# Patient Record
Sex: Female | Born: 1940 | Race: White | Hispanic: No | Marital: Married | Smoking: Never smoker
Health system: Southern US, Community
[De-identification: ages and names within clinical notes are randomized; demographics above are authoritative.]

## PROBLEM LIST (undated history)

## (undated) DIAGNOSIS — I729 Aneurysm of unspecified site: Secondary | ICD-10-CM

## (undated) DIAGNOSIS — I499 Cardiac arrhythmia, unspecified: Secondary | ICD-10-CM

## (undated) DIAGNOSIS — E785 Hyperlipidemia, unspecified: Secondary | ICD-10-CM

## (undated) DIAGNOSIS — M810 Age-related osteoporosis without current pathological fracture: Secondary | ICD-10-CM

## (undated) DIAGNOSIS — I10 Essential (primary) hypertension: Secondary | ICD-10-CM

## (undated) DIAGNOSIS — F419 Anxiety disorder, unspecified: Secondary | ICD-10-CM

## (undated) DIAGNOSIS — R011 Cardiac murmur, unspecified: Secondary | ICD-10-CM

## (undated) HISTORY — PX: BREAST CYST EXCISION: SHX579

---

## 1988-01-10 HISTORY — PX: BREAST BIOPSY: SHX20

## 2011-01-10 HISTORY — PX: EYE SURGERY: SHX253

## 2020-03-17 ENCOUNTER — Other Ambulatory Visit: Payer: Self-pay | Admitting: Internal Medicine

## 2020-03-17 ENCOUNTER — Other Ambulatory Visit (HOSPITAL_COMMUNITY): Payer: Self-pay | Admitting: Internal Medicine

## 2020-03-17 DIAGNOSIS — I728 Aneurysm of other specified arteries: Secondary | ICD-10-CM

## 2020-04-02 ENCOUNTER — Other Ambulatory Visit: Payer: Self-pay

## 2020-04-02 ENCOUNTER — Ambulatory Visit
Admission: RE | Admit: 2020-04-02 | Discharge: 2020-04-02 | Disposition: A | Discharge status: Home / Self Care | Payer: Medicare PPO | Source: Ambulatory Visit | Attending: Internal Medicine | Admitting: Internal Medicine

## 2020-04-02 DIAGNOSIS — I728 Aneurysm of other specified arteries: Secondary | ICD-10-CM | POA: Diagnosis not present

## 2020-04-02 HISTORY — DX: Essential (primary) hypertension: I10

## 2020-04-02 LAB — POCT I-STAT CREATININE: Creatinine, Ser: 0.6 mg/dL (ref 0.44–1.00)

## 2020-04-02 MED ORDER — IOHEXOL 350 MG/ML SOLN
80.0000 mL | Freq: Once | INTRAVENOUS | Status: AC | PRN
  Administered 2020-04-02: 80 mL via INTRAVENOUS

## 2020-08-26 ENCOUNTER — Encounter: Payer: Self-pay | Admitting: *Deleted

## 2020-08-27 ENCOUNTER — Encounter: Payer: Self-pay | Admitting: *Deleted

## 2020-08-27 ENCOUNTER — Encounter
Admission: RE | Disposition: A | Discharge status: Home / Self Care | Payer: Self-pay | Source: Home / Self Care | Attending: Gastroenterology

## 2020-08-27 ENCOUNTER — Ambulatory Visit
Admission: RE | Admit: 2020-08-27 | Discharge: 2020-08-27 | Disposition: A | Discharge status: Home / Self Care | Payer: Medicare PPO | Attending: Gastroenterology | Admitting: Gastroenterology

## 2020-08-27 ENCOUNTER — Ambulatory Visit: Payer: Medicare PPO | Admitting: Anesthesiology

## 2020-08-27 DIAGNOSIS — F419 Anxiety disorder, unspecified: Secondary | ICD-10-CM | POA: Insufficient documentation

## 2020-08-27 DIAGNOSIS — K640 First degree hemorrhoids: Secondary | ICD-10-CM | POA: Diagnosis not present

## 2020-08-27 DIAGNOSIS — Z1211 Encounter for screening for malignant neoplasm of colon: Secondary | ICD-10-CM | POA: Insufficient documentation

## 2020-08-27 DIAGNOSIS — I10 Essential (primary) hypertension: Secondary | ICD-10-CM | POA: Insufficient documentation

## 2020-08-27 DIAGNOSIS — K635 Polyp of colon: Secondary | ICD-10-CM | POA: Diagnosis not present

## 2020-08-27 DIAGNOSIS — Z885 Allergy status to narcotic agent status: Secondary | ICD-10-CM | POA: Insufficient documentation

## 2020-08-27 DIAGNOSIS — Z7901 Long term (current) use of anticoagulants: Secondary | ICD-10-CM | POA: Diagnosis not present

## 2020-08-27 DIAGNOSIS — Z79899 Other long term (current) drug therapy: Secondary | ICD-10-CM | POA: Insufficient documentation

## 2020-08-27 DIAGNOSIS — I4891 Unspecified atrial fibrillation: Secondary | ICD-10-CM | POA: Diagnosis not present

## 2020-08-27 HISTORY — DX: Hyperlipidemia, unspecified: E78.5

## 2020-08-27 HISTORY — DX: Anxiety disorder, unspecified: F41.9

## 2020-08-27 HISTORY — DX: Cardiac murmur, unspecified: R01.1

## 2020-08-27 HISTORY — PX: COLONOSCOPY WITH PROPOFOL: SHX5780

## 2020-08-27 HISTORY — DX: Aneurysm of unspecified site: I72.9

## 2020-08-27 HISTORY — DX: Age-related osteoporosis without current pathological fracture: M81.0

## 2020-08-27 HISTORY — DX: Cardiac arrhythmia, unspecified: I49.9

## 2020-08-27 SURGERY — COLONOSCOPY WITH PROPOFOL
Anesthesia: General

## 2020-08-27 MED ORDER — SODIUM CHLORIDE 0.9 % IV SOLN
INTRAVENOUS | Status: DC
  Administered 2020-08-27: 1000 mL via INTRAVENOUS

## 2020-08-27 MED ORDER — PROPOFOL 500 MG/50ML IV EMUL
INTRAVENOUS | Status: AC
  Filled 2020-08-27: qty 50

## 2020-08-27 MED ORDER — PROPOFOL 500 MG/50ML IV EMUL
INTRAVENOUS | Status: DC | PRN
  Administered 2020-08-27: 175 ug/kg/min via INTRAVENOUS

## 2020-08-27 MED ORDER — LIDOCAINE HCL (CARDIAC) PF 100 MG/5ML IV SOSY
PREFILLED_SYRINGE | INTRAVENOUS | Status: DC | PRN
Start: 2020-08-27 — End: 2020-08-27
  Administered 2020-08-27: 50 mg via INTRAVENOUS

## 2020-08-27 MED ORDER — LIDOCAINE HCL (PF) 2 % IJ SOLN
INTRAMUSCULAR | Status: AC
  Filled 2020-08-27: qty 5

## 2020-08-27 MED ORDER — PROPOFOL 10 MG/ML IV BOLUS
INTRAVENOUS | Status: DC | PRN
  Administered 2020-08-27: 20 mg via INTRAVENOUS
  Administered 2020-08-27: 60 mg via INTRAVENOUS
  Administered 2020-08-27 (×2): 20 mg via INTRAVENOUS

## 2020-08-27 MED ORDER — DEXMEDETOMIDINE (PRECEDEX) IN NS 20 MCG/5ML (4 MCG/ML) IV SYRINGE
PREFILLED_SYRINGE | INTRAVENOUS | Status: DC | PRN
  Administered 2020-08-27: 8 ug via INTRAVENOUS

## 2020-08-27 NOTE — Transfer of Care (Signed)
Immediate Anesthesia Transfer of Care Note  Patient: Elizabeth Sweeney  Procedure(s) Performed: COLONOSCOPY WITH PROPOFOL  Patient Location: PACU  Anesthesia Type:General  Level of Consciousness: sedated  Airway & Oxygen Therapy: Patient Spontanous Breathing  Post-op Assessment: Report given to RN and Post -op Vital signs reviewed and stable  Post vital signs: Reviewed and stable  Last Vitals:  Vitals Value Taken Time  BP 112/64 08/27/20 1132  Temp    Pulse 74 08/27/20 1132  Resp 28 08/27/20 1132  SpO2 100 % 08/27/20 1132    Last Pain:  Vitals:   08/27/20 1024  TempSrc: Temporal  PainSc: 0-No pain         Complications: No notable events documented.

## 2020-08-27 NOTE — H&P (Signed)
Outpatient short stay form Pre-procedure 08/27/2020  Regis Bill, MD  Primary Physician: Lynnea Ferrier, MD  Reason for visit:  Surveillance colonoscopy  History of present illness:   80 y/o lady with history of a. Fib on eliquis with last dose 5 days ago, hypertension, and anxiety here for surveillance colonoscopy. Last colonoscopy was 5 years ago with two small Ta's. No family history of GI malignancies. No significant abdominal surgeries. No new symptoms.    Current Facility-Administered Medications:    0.9 %  sodium chloride infusion, , Intravenous, Continuous, Jaiceon Collister, Rossie Muskrat, MD, Last Rate: 20 mL/hr at 08/27/20 1049, Continued from Pre-op at 08/27/20 1049  Medications Prior to Admission  Medication Sig Dispense Refill Last Dose   apixaban (ELIQUIS) 5 MG TABS tablet Take 5 mg by mouth 2 (two) times daily.   08/22/2020   calcium carbonate (OS-CAL) 600 MG TABS tablet Take 1 tablet by mouth daily with breakfast.   Past Week   conjugated estrogens (PREMARIN) vaginal cream Place 1 Applicatorful vaginally 2 (two) times a week.   08/26/2020   lisinopril (ZESTRIL) 20 MG tablet Take 700 mg by mouth daily.   08/27/2020 at 0700   loratadine (CLARITIN) 10 MG tablet Take 10 mg by mouth daily.   Past Week   ofloxacin (OCUFLOX) 0.3 % ophthalmic solution 1 drop 4 (four) times daily.   Past Week   olopatadine (PATANOL) 0.1 % ophthalmic solution 1 drop at bedtime as needed for allergies.   Past Week   Omega-3 300 MG CAPS Take 1 capsule by mouth daily.   Past Week   promethazine (PHENERGAN) 25 MG suppository Place 25 mg rectally every 6 (six) hours as needed for nausea or vomiting.    at prn   psyllium (METAMUCIL) 58.6 % powder Take 1 packet by mouth daily.   Past Week   ALPRAZolam (XANAX) 0.25 MG tablet Take 0.25 mg by mouth at bedtime as needed for anxiety.    at prn   Suvorexant (BELSOMRA) 15 MG TABS Take 15 mg by mouth at bedtime as needed.        Allergies  Allergen Reactions    Codeine Nausea Only     Past Medical History:  Diagnosis Date   Aneurysm (HCC)    Anxiety    Dysrhythmia    Atrial Fibrillation   Heart murmur    Hyperlipidemia    Hypertension    Osteoporosis     Review of systems:  Otherwise negative.    Physical Exam  Gen: Alert, oriented. Appears stated age.  HEENT: PERRLA. Lungs: No respiratory distress CV: RRR Abd: soft, benign, no masses Ext: No edema    Planned procedures: Proceed with colonoscopy. The patient understands the nature of the planned procedure, indications, risks, alternatives and potential complications including but not limited to bleeding, infection, perforation, damage to internal organs and possible oversedation/side effects from anesthesia. The patient agrees and gives consent to proceed.  Please refer to procedure notes for findings, recommendations and patient disposition/instructions.     Regis Bill, MD [facility]

## 2020-08-27 NOTE — Anesthesia Procedure Notes (Signed)
Date/Time: 08/27/2020 10:57 AM Performed by: Ginger Carne, CRNA Pre-anesthesia Checklist: Patient identified, Emergency Drugs available, Suction available and Patient being monitored Patient Re-evaluated:Patient Re-evaluated prior to induction Preoxygenation: Pre-oxygenation with 100% oxygen Induction Type: IV induction

## 2020-08-27 NOTE — Anesthesia Preprocedure Evaluation (Signed)
Anesthesia Evaluation  Patient identified by MRN, date of birth, ID band Patient awake    Reviewed: Allergy & Precautions, NPO status , Patient's Chart, lab work & pertinent test results  History of Anesthesia Complications Negative for: history of anesthetic complications  Airway Mallampati: III  TM Distance: <3 FB Neck ROM: limited    Dental  (+) Chipped   Pulmonary neg pulmonary ROS, neg shortness of breath,    Pulmonary exam normal        Cardiovascular Exercise Tolerance: Good hypertension, Normal cardiovascular exam+ dysrhythmias Atrial Fibrillation      Neuro/Psych Anxiety negative neurological ROS  negative psych ROS   GI/Hepatic negative GI ROS, Neg liver ROS, neg GERD  ,  Endo/Other  negative endocrine ROS  Renal/GU negative Renal ROS  negative genitourinary   Musculoskeletal   Abdominal   Peds  Hematology negative hematology ROS (+)   Anesthesia Other Findings Past Medical History: No date: Aneurysm (HCC) No date: Anxiety No date: Dysrhythmia     Comment:  Atrial Fibrillation No date: Heart murmur No date: Hyperlipidemia No date: Hypertension No date: Osteoporosis  Past Surgical History: 2013: EYE SURGERY; Bilateral     Comment:  Cataract Extraction  BMI    Body Mass Index: 21.35 kg/m      Reproductive/Obstetrics negative OB ROS                             Anesthesia Physical Anesthesia Plan  ASA: 3  Anesthesia Plan: General   Post-op Pain Management:    Induction: Intravenous  PONV Risk Score and Plan: Propofol infusion and TIVA  Airway Management Planned: Natural Airway and Nasal Cannula  Additional Equipment:   Intra-op Plan:   Post-operative Plan:   Informed Consent: I have reviewed the patients History and Physical, chart, labs and discussed the procedure including the risks, benefits and alternatives for the proposed anesthesia with the  patient or authorized representative who has indicated his/her understanding and acceptance.     Dental Advisory Given  Plan Discussed with: Anesthesiologist, CRNA and Surgeon  Anesthesia Plan Comments: (Patient consented for risks of anesthesia including but not limited to:  - adverse reactions to medications - risk of airway placement if required - damage to eyes, teeth, lips or other oral mucosa - nerve damage due to positioning  - sore throat or hoarseness - Damage to heart, brain, nerves, lungs, other parts of body or loss of life  Patient voiced understanding.)        Anesthesia Quick Evaluation

## 2020-08-27 NOTE — Op Note (Signed)
[facility] Patient Name: Elizabeth Sweeney Procedure Date: 08/27/2020 10:55 AM MRN: [id number] Account #: [id number] Date of Birth: 1940-07-24 Admit Type: Outpatient Age: 80 Room: [facility] ENDO ROOM 3 Gender: Female Note Status: Finalized Procedure:             Colonoscopy Indications:           Surveillance: Personal history of adenomatous polyps                         on last colonoscopy 5 years ago Providers:             Andrey Farmer MD, MD Referring MD:          Ramonita Lab, MD (Referring MD) Medicines:             Monitored Anesthesia Care Complications:         No immediate complications. Estimated blood loss:                         Minimal. Procedure:             Pre-Anesthesia Assessment:                        - Prior to the procedure, a History and Physical was                         performed, and patient medications and allergies were                         reviewed. The patient is competent. The risks and                         benefits of the procedure and the sedation options and                         risks were discussed with the patient. All questions                         were answered and informed consent was obtained.                         Patient identification and proposed procedure were                         verified by the physician, the nurse, the anesthetist                         and the technician in the endoscopy suite. Mental                         Status Examination: alert and oriented. Airway                         Examination: normal oropharyngeal airway and neck                         mobility. Respiratory Examination: clear to  auscultation. CV Examination: normal. Prophylactic                         Antibiotics: The patient does not require prophylactic                         antibiotics. Prior Anticoagulants: The patient has                         taken Eliquis (apixaban),  last dose was 5 days prior                         to procedure. ASA Grade Assessment: II - A patient                         with mild systemic disease. After reviewing the risks                         and benefits, the patient was deemed in satisfactory                         condition to undergo the procedure. The anesthesia                         plan was to use monitored anesthesia care (MAC).                         Immediately prior to administration of medications,                         the patient was re-assessed for adequacy to receive                         sedatives. The heart rate, respiratory rate, oxygen                         saturations, blood pressure, adequacy of pulmonary                         ventilation, and response to care were monitored                         throughout the procedure. The physical status of the                         patient was re-assessed after the procedure.                        After obtaining informed consent, the colonoscope was                         passed under direct vision. Throughout the procedure,                         the patient's blood pressure, pulse, and oxygen                         saturations were monitored continuously. The  Colonoscope was introduced through the anus and                         advanced to the the cecum, identified by appendiceal                         orifice and ileocecal valve. The colonoscopy was                         performed without difficulty. The patient tolerated                         the procedure well. The quality of the bowel                         preparation was good. Findings:      The perianal and digital rectal examinations were normal.      A 2 mm polyp was found in the sigmoid colon. The polyp was sessile. The       polyp was removed with a jumbo cold forceps. Resection and retrieval       were complete. Estimated blood loss was minimal.       Internal hemorrhoids were found during retroflexion. The hemorrhoids       were Grade I (internal hemorrhoids that do not prolapse).      The exam was otherwise without abnormality on direct and retroflexion       views. Impression:            - One 2 mm polyp in the sigmoid colon, removed with a                         jumbo cold forceps. Resected and retrieved.                        - Internal hemorrhoids.                        - The examination was otherwise normal on direct and                         retroflexion views. Recommendation:        - Discharge patient to home.                        - Resume previous diet.                        - Continue present medications.                        - Await pathology results.                        - Repeat colonoscopy is not recommended due to current                         age (71 years or older) for surveillance.                        - Return to referring physician as previously  scheduled. Procedure Code(s):     --- Professional ---                        [phone], Colonoscopy, flexible; with biopsy, single or                         multiple Diagnosis Code(s):     --- Professional ---                        Z86.010, Personal history of colonic polyps                        K63.5, Polyp of colon                        K64.0, First degree hemorrhoids CPT copyright 2019 American Medical Association. All rights reserved. The codes documented in this report are preliminary and upon coder review may  be revised to meet current compliance requirements. Andrey Farmer MD, MD 08/27/2020 11:28:23 AM Number of Addenda: 0 Note Initiated On: 08/27/2020 10:55 AM Scope Withdrawal Time: 0 hours 8 minutes 50 seconds  Total Procedure Duration: 0 hours 19 minutes 6 seconds  Estimated Blood Loss:  Estimated blood loss was minimal.      [facility]

## 2020-08-27 NOTE — Anesthesia Postprocedure Evaluation (Signed)
Anesthesia Post Note  Patient: Elizabeth Sweeney  Procedure(s) Performed: COLONOSCOPY WITH PROPOFOL  Patient location during evaluation: Endoscopy Anesthesia Type: General Level of consciousness: awake and alert Pain management: pain level controlled Vital Signs Assessment: post-procedure vital signs reviewed and stable Respiratory status: spontaneous breathing, nonlabored ventilation, respiratory function stable and patient connected to nasal cannula oxygen Cardiovascular status: blood pressure returned to baseline and stable Postop Assessment: no apparent nausea or vomiting Anesthetic complications: no   No notable events documented.   Last Vitals:  Vitals:   08/27/20 1137 08/27/20 1206  BP: 109/64 129/78  Pulse: 74 78  Resp: 16   Temp:    SpO2: 99% 99%    Last Pain:  Vitals:   08/27/20 1137  TempSrc:   PainSc: 0-No pain                 Cleda Mccreedy Tobie Hellen

## 2020-08-27 NOTE — Interval H&P Note (Signed)
History and Physical Interval Note:  08/27/2020 10:54 AM  Elizabeth Sweeney  has presented today for surgery, with the diagnosis of HX ADEN POLYPS.  The various methods of treatment have been discussed with the patient and family. After consideration of risks, benefits and other options for treatment, the patient has consented to  Procedure(s): COLONOSCOPY WITH PROPOFOL (N/A) as a surgical intervention.  The patient's history has been reviewed, patient examined, no change in status, stable for surgery.  I have reviewed the patient's chart and labs.  Questions were answered to the patient's satisfaction.     Regis Bill  Ok to proceed with colonoscopy

## 2020-08-30 ENCOUNTER — Encounter: Payer: Self-pay | Admitting: Gastroenterology

## 2020-08-30 LAB — SURGICAL PATHOLOGY

## 2020-09-20 ENCOUNTER — Other Ambulatory Visit: Payer: Self-pay | Admitting: Internal Medicine

## 2020-09-20 DIAGNOSIS — Z1231 Encounter for screening mammogram for malignant neoplasm of breast: Secondary | ICD-10-CM

## 2020-09-29 ENCOUNTER — Ambulatory Visit
Admission: RE | Admit: 2020-09-29 | Discharge: 2020-09-29 | Disposition: A | Discharge status: Home / Self Care | Payer: Medicare PPO | Source: Ambulatory Visit | Attending: Internal Medicine | Admitting: Internal Medicine

## 2020-09-29 ENCOUNTER — Other Ambulatory Visit: Payer: Self-pay

## 2020-09-29 DIAGNOSIS — Z1231 Encounter for screening mammogram for malignant neoplasm of breast: Secondary | ICD-10-CM | POA: Diagnosis not present

## 2020-10-01 ENCOUNTER — Inpatient Hospital Stay
Admission: RE | Admit: 2020-10-01 | Discharge: 2020-10-01 | Disposition: A | Discharge status: Home / Self Care | Payer: Self-pay | Source: Ambulatory Visit | Attending: *Deleted | Admitting: *Deleted

## 2020-10-01 ENCOUNTER — Other Ambulatory Visit: Payer: Self-pay | Admitting: *Deleted

## 2020-10-01 DIAGNOSIS — Z1231 Encounter for screening mammogram for malignant neoplasm of breast: Secondary | ICD-10-CM

## 2021-03-25 ENCOUNTER — Other Ambulatory Visit: Payer: Self-pay | Admitting: Internal Medicine

## 2021-03-25 DIAGNOSIS — I728 Aneurysm of other specified arteries: Secondary | ICD-10-CM

## 2021-04-26 ENCOUNTER — Ambulatory Visit
Admission: RE | Admit: 2021-04-26 | Discharge: 2021-04-26 | Disposition: A | Discharge status: Home / Self Care | Payer: Medicare PPO | Source: Ambulatory Visit | Attending: Internal Medicine | Admitting: Internal Medicine

## 2021-04-26 ENCOUNTER — Other Ambulatory Visit: Payer: Medicare PPO

## 2021-04-26 DIAGNOSIS — I728 Aneurysm of other specified arteries: Secondary | ICD-10-CM | POA: Diagnosis present

## 2021-04-26 LAB — POCT I-STAT CREATININE: Creatinine, Ser: 0.9 mg/dL (ref 0.44–1.00)

## 2021-04-26 MED ORDER — IOHEXOL 350 MG/ML SOLN
100.0000 mL | Freq: Once | INTRAVENOUS | Status: AC | PRN
  Administered 2021-04-26: 100 mL via INTRAVENOUS

## 2021-09-26 ENCOUNTER — Other Ambulatory Visit: Payer: Self-pay | Admitting: Internal Medicine

## 2021-09-26 DIAGNOSIS — Z1231 Encounter for screening mammogram for malignant neoplasm of breast: Secondary | ICD-10-CM

## 2021-10-18 ENCOUNTER — Ambulatory Visit
Admission: RE | Admit: 2021-10-18 | Discharge: 2021-10-18 | Disposition: A | Discharge status: Home / Self Care | Payer: Medicare PPO | Source: Ambulatory Visit | Attending: Internal Medicine | Admitting: Internal Medicine

## 2021-10-18 DIAGNOSIS — Z1231 Encounter for screening mammogram for malignant neoplasm of breast: Secondary | ICD-10-CM | POA: Insufficient documentation

## 2022-09-05 ENCOUNTER — Other Ambulatory Visit: Payer: Self-pay | Admitting: Internal Medicine

## 2022-09-05 DIAGNOSIS — Z1231 Encounter for screening mammogram for malignant neoplasm of breast: Secondary | ICD-10-CM

## 2022-10-20 ENCOUNTER — Ambulatory Visit
Admission: RE | Admit: 2022-10-20 | Discharge: 2022-10-20 | Disposition: A | Discharge status: Home / Self Care | Payer: Medicare PPO | Source: Ambulatory Visit | Attending: Internal Medicine | Admitting: Internal Medicine

## 2022-10-20 DIAGNOSIS — Z1231 Encounter for screening mammogram for malignant neoplasm of breast: Secondary | ICD-10-CM | POA: Diagnosis present

## 2023-01-13 ENCOUNTER — Other Ambulatory Visit: Payer: Self-pay

## 2023-01-13 ENCOUNTER — Emergency Department: Payer: Medicare PPO

## 2023-01-13 ENCOUNTER — Emergency Department
Admission: EM | Admit: 2023-01-13 | Discharge: 2023-01-13 | Disposition: A | Discharge status: Home / Self Care | Payer: Medicare PPO | Attending: Emergency Medicine | Admitting: Emergency Medicine

## 2023-01-13 DIAGNOSIS — I10 Essential (primary) hypertension: Secondary | ICD-10-CM | POA: Insufficient documentation

## 2023-01-13 DIAGNOSIS — I4891 Unspecified atrial fibrillation: Secondary | ICD-10-CM | POA: Insufficient documentation

## 2023-01-13 DIAGNOSIS — M7121 Synovial cyst of popliteal space [Baker], right knee: Secondary | ICD-10-CM | POA: Insufficient documentation

## 2023-01-13 DIAGNOSIS — M79604 Pain in right leg: Secondary | ICD-10-CM | POA: Diagnosis present

## 2023-01-13 NOTE — ED Provider Triage Note (Signed)
 Emergency Medicine Provider Triage Evaluation Note  Elizabeth Sweeney , a 83 y.o. female  was evaluated in triage.  Pt complains of pain behind the right knee. Started a few days ago, concerned about a possible blood clot. Takes eliquis. Patient has not taken an pain meds for it so far.   Review of Systems  Positive: Right knee pain Negative:   Physical Exam  There were no vitals taken for this visit. Gen:   Awake, no distress   Resp:  Normal effort  MSK:   Moves extremities without difficulty  Other:    Medical Decision Making  Medically screening exam initiated at 11:59 AM.  Appropriate orders placed.  Sahiba Granholm was informed that the remainder of the evaluation will be completed by another provider, this initial triage assessment does not replace that evaluation, and the importance of remaining in the ED until their evaluation is complete.     Cleaster Tinnie LABOR, PA-C 01/13/23 1201

## 2023-01-13 NOTE — ED Notes (Signed)
 RLE pain to popliteal area since 2 weeks, now some pain to R calf. No redness noted. Pt takes Eliquis for a fib.

## 2023-01-13 NOTE — ED Provider Notes (Signed)
 [facility] Provider Note    Event Date/Time   First MD Initiated Contact with Patient 01/13/23 1232     (approximate)   History   Leg Pain   HPI  Elizabeth Sweeney is a 83 y.o. female  with PMH of HTN, anxiety, afib presents for evaluation of pain behind her right knee.  Patient states that this is something that has bothered her on occasion on and off for several months.  In the last week she has had increased pain behind the knee.  She presents today she noticed some swelling and wanted to make sure she did not have a blood clot.  She endorses a very active lifestyle including yoga and regular visits to the gym to use the elliptical and stationary bike.      Physical Exam   Triage Vital Signs: ED Triage Vitals  Encounter Vitals Group     BP 01/13/23 1203 (!) 154/84     Systolic BP Percentile --      Diastolic BP Percentile --      Pulse Rate 01/13/23 1203 93     Resp 01/13/23 1203 20     Temp 01/13/23 1203 98 F (36.7 C)     Temp Source 01/13/23 1203 Oral     SpO2 01/13/23 1203 99 %     Weight --      Height --      Head Circumference --      Peak Flow --      Pain Score 01/13/23 1202 6     Pain Loc --      Pain Education --      Exclude from Growth Chart --     Most recent vital signs: Vitals:   01/13/23 1203  BP: (!) 154/84  Pulse: 93  Resp: 20  Temp: 98 F (36.7 C)  SpO2: 99%   General: Awake, no distress.  CV:  Good peripheral perfusion.  Resp:  Normal effort.  Abd:  No distention.  Other:  Knee is not swollen when compared with the left, no overlying skin changes, mild tenderness to palpation of the posterior knee, palpable cyst can be felt in the posterior lateral right knee, ROM maintained, dorsalis pedis pulse 2+ and regular.   ED Results / Procedures / Treatments   Labs (all labs ordered are listed, but only abnormal results are displayed) Labs Reviewed - No data to display   RADIOLOGY  Right lower extremity  ultrasound obtained, interpreted the images as well as reviewed the radiologist report which was negative for DVT but did show a small popliteal cyst.    PROCEDURES:  Critical Care performed: No  Procedures   MEDICATIONS ORDERED IN ED: Medications - No data to display   IMPRESSION / MDM / ASSESSMENT AND PLAN / ED COURSE  I reviewed the triage vital signs and the nursing notes.                             83 year old female presents for evaluation of posterior right knee pain.  Vital signs stable, aside from elevated blood pressure, patient NAD on exam.  Differential diagnosis includes, but is not limited to, Baker's cyst, DVT, muscle strain, fracture, dislocation.  Patient's presentation is most consistent with acute complicated illness / injury requiring diagnostic workup.  Ultrasound was negative for DVT but did show a small popliteal cyst.  I advised patient to take it easy for  the next couple days, she can resume activities as tolerated.  I also recommended Tylenol, icing and elevation.  She is given information for orthopedic follow-up.  She voiced understanding, all questions were answered and she was stable at discharge.     FINAL CLINICAL IMPRESSION(S) / ED DIAGNOSES   Final diagnoses:  Baker's cyst of knee, right     Rx / DC Orders   ED Discharge Orders     None        Note:  This document was prepared using Dragon voice recognition software and may include unintentional dictation errors.   Cleaster Tinnie LABOR, PA-C 01/13/23 1409    Bradler, Evan K, MD 01/13/23 [phone]

## 2023-01-13 NOTE — ED Triage Notes (Signed)
 Pt to ED via POV from home. Pt reports right posterior knee  pain x1wk. Pt denies SOB or CP. Pt reports on Eliquis for afib.

## 2023-01-13 NOTE — Discharge Instructions (Addendum)
 You can take 650 mg of tylenol as needed for pain. Ice and elevate your knee as needed. This will help with the swelling and pain. You could also try wearing a supportive knee sleeve.

## 2023-08-03 IMAGING — MG MM DIGITAL SCREENING BILAT W/ TOMO AND CAD
8 series · 8 of 24 positions shown · non-contrast
Comparison: Previous exam(s).

CLINICAL DATA: Screening.

EXAM:
DIGITAL SCREENING BILATERAL MAMMOGRAM WITH TOMOSYNTHESIS AND CAD
TECHNIQUE: Bilateral screening digital craniocaudal and mediolateral oblique
mammograms were obtained. Bilateral screening digital breast
tomosynthesis was performed. The images were evaluated with
computer-aided detection.

[R CC synth-2D]
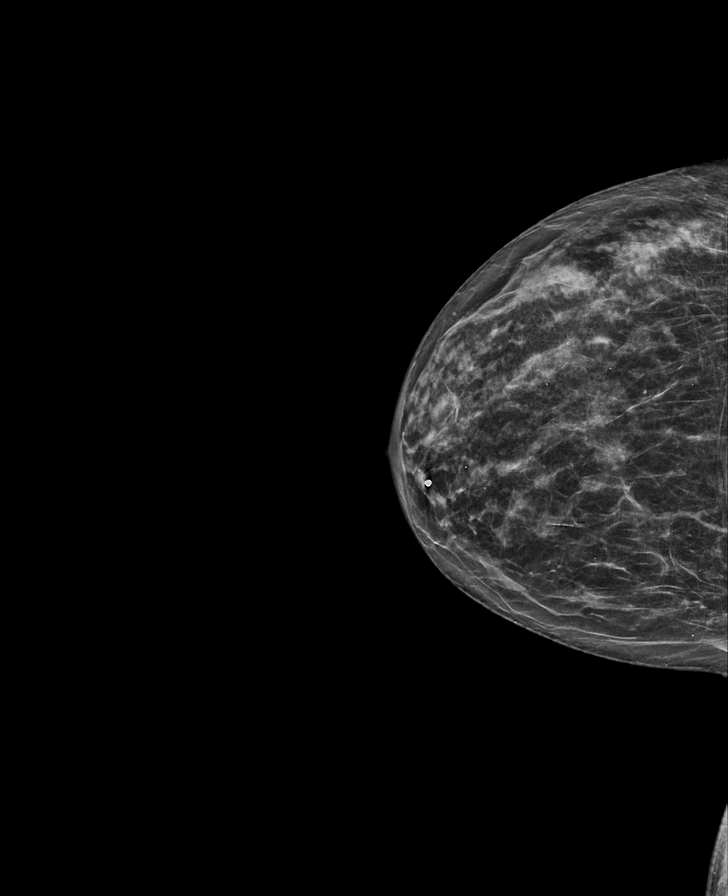

[R MLO synth-2D]
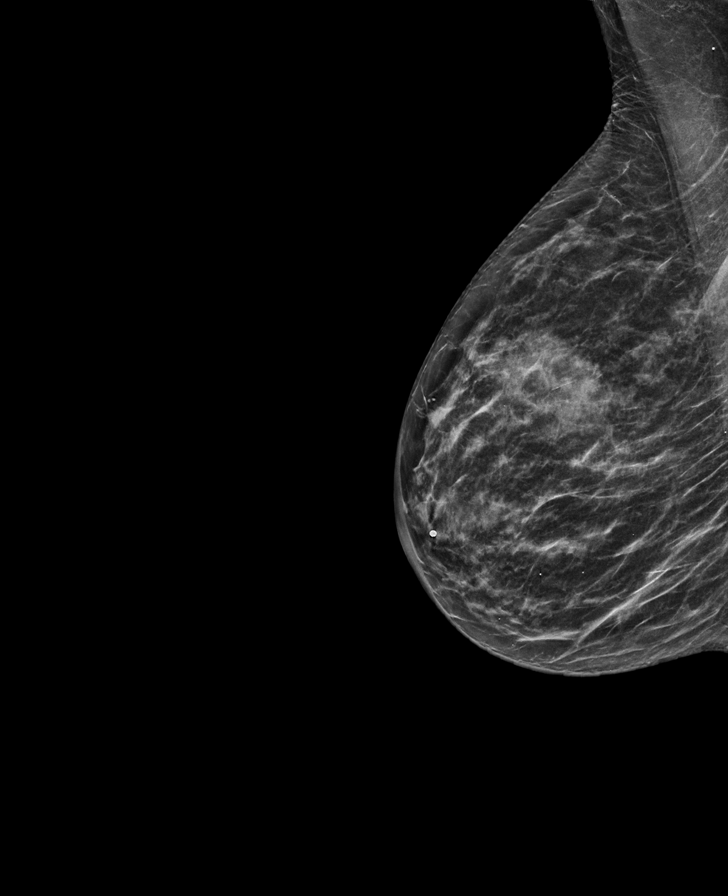

[L MLO synth-2D]
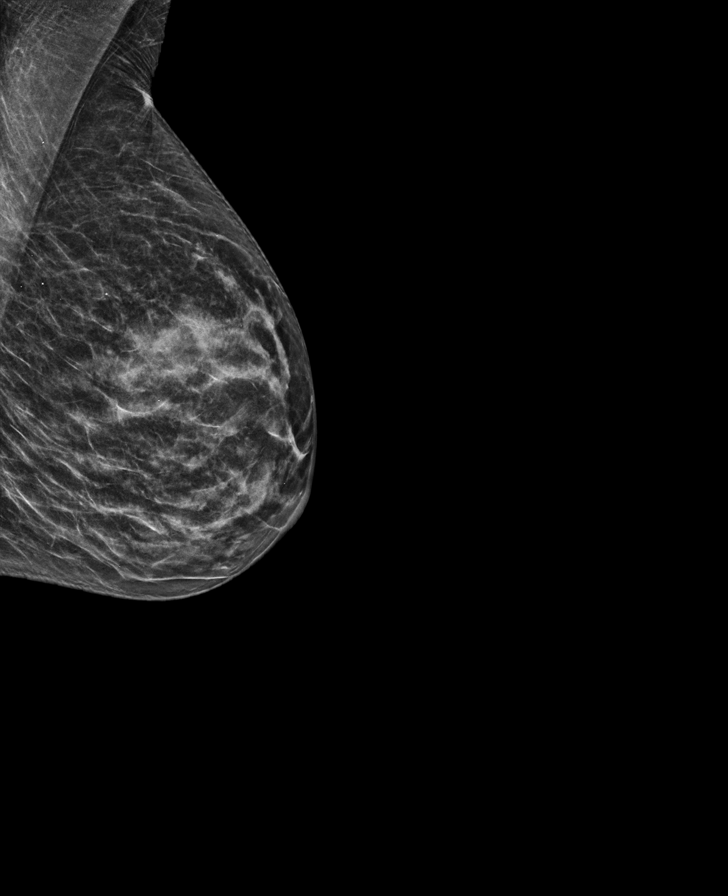

[L CC synth-2D]
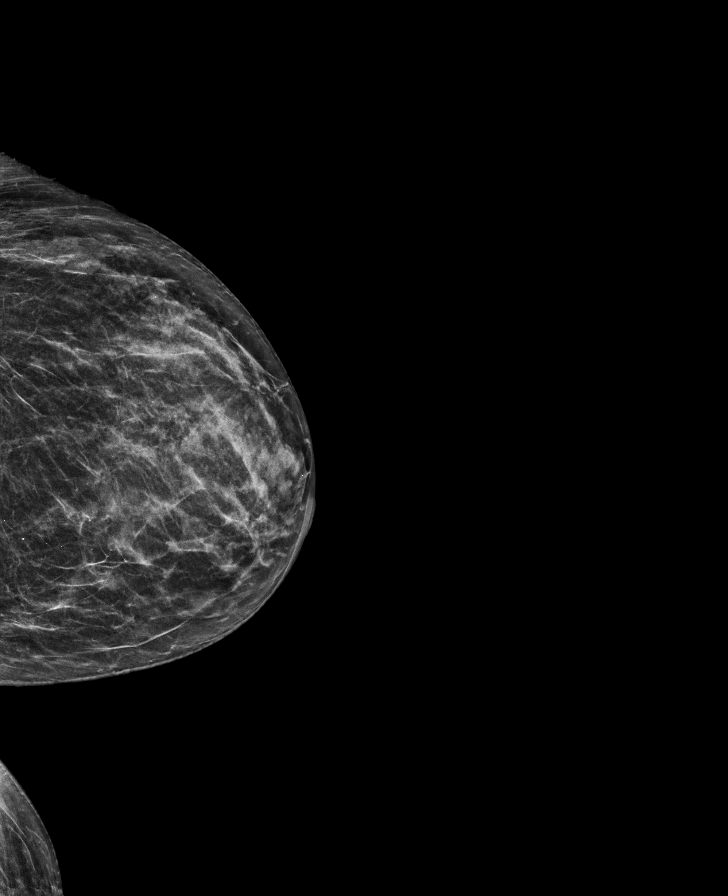

[R MLO tomo · tomo slice 29/56.0]
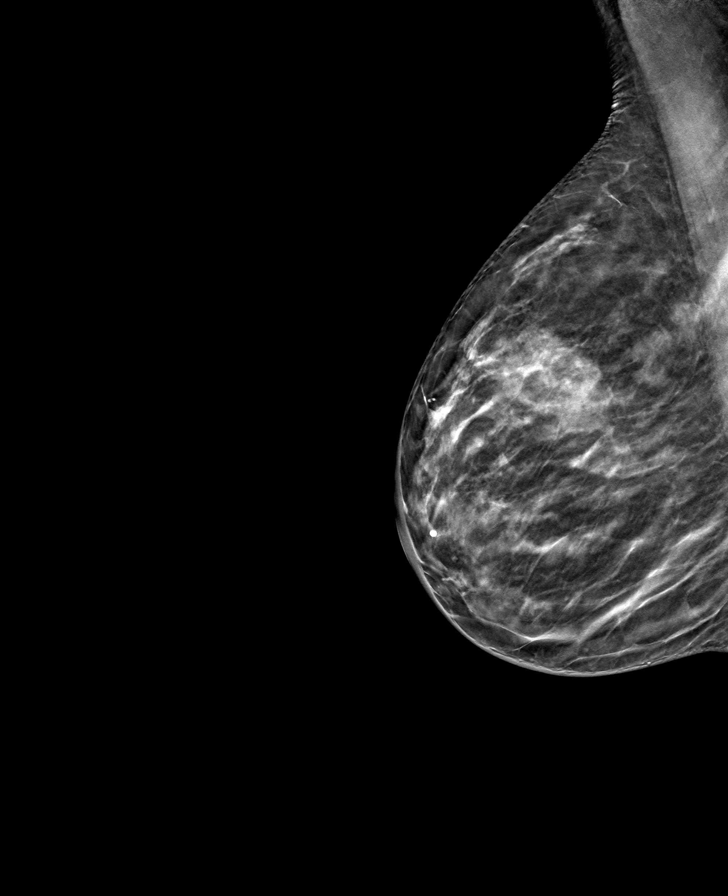

[L CC tomo · tomo slice 25/50.0]
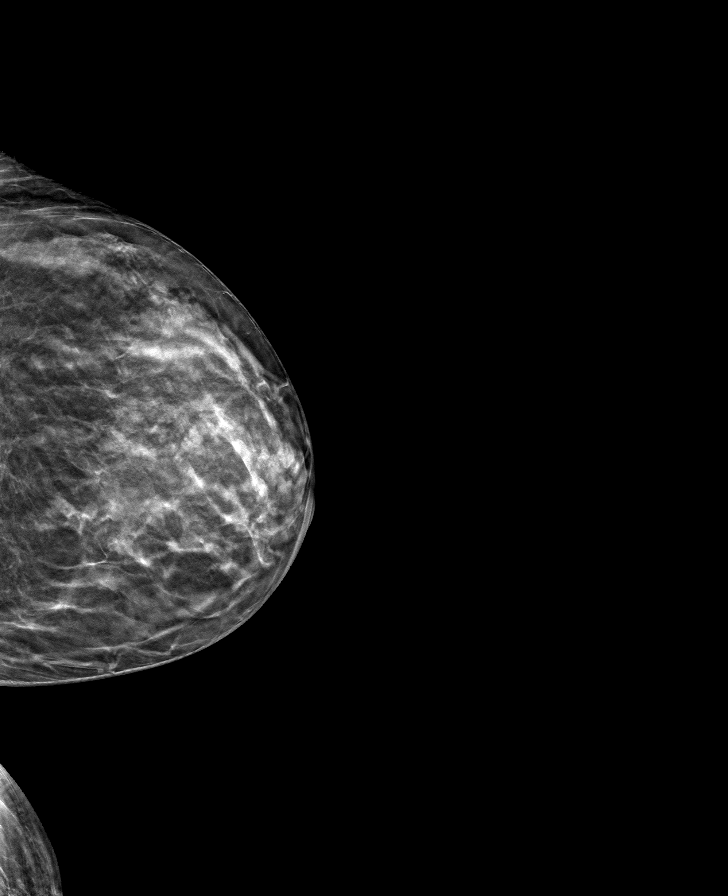

[L MLO tomo · tomo slice 27/53.0]
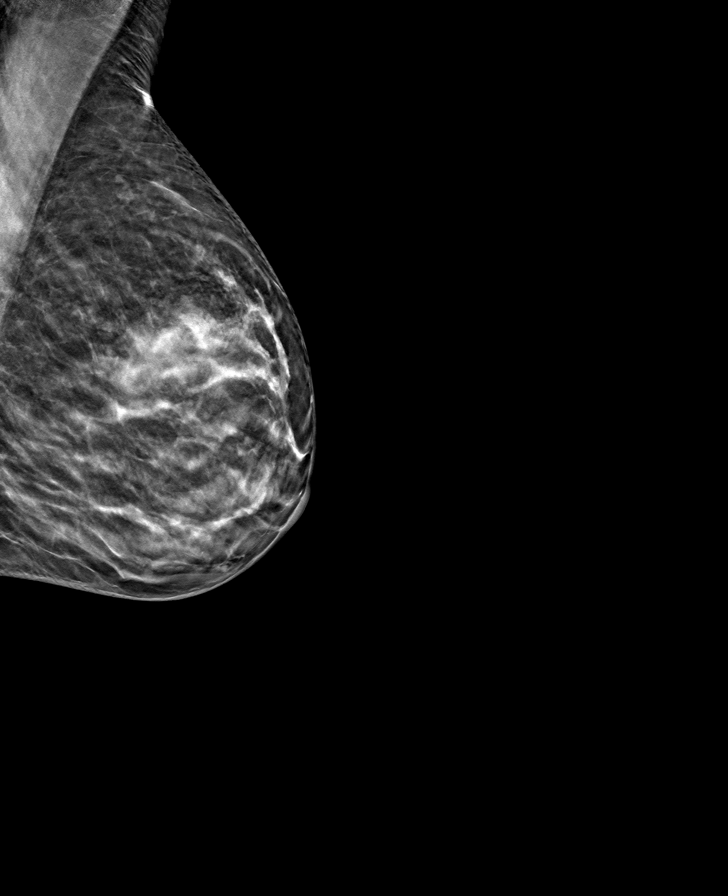

[R CC tomo · tomo slice 29/58.0]
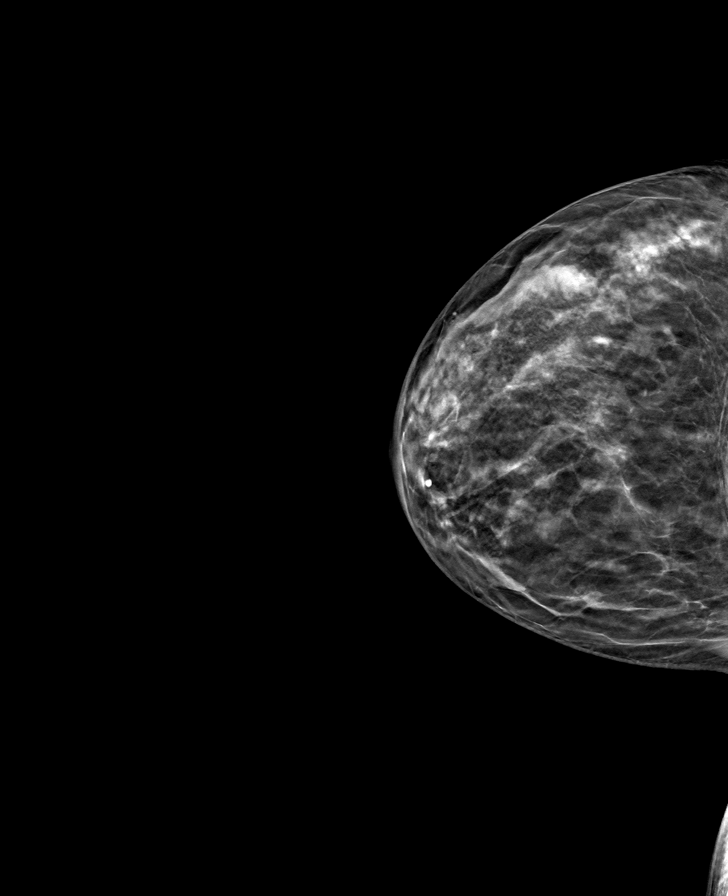

[8 of 24 positions shown; findings below may reference images not displayed]

ACR Breast Density Category c: The breast tissue is heterogeneously
dense, which may obscure small masses.
FINDINGS: There are no findings suspicious for malignancy.
IMPRESSION: No mammographic evidence of malignancy. A result letter of this
screening mammogram will be mailed directly to the patient.

RECOMMENDATION:
Screening mammogram in one year. (Code:Q3-W-BC3)

BI-RADS CATEGORY  1: Negative.

## 2023-09-11 ENCOUNTER — Other Ambulatory Visit: Payer: Self-pay | Admitting: Internal Medicine

## 2023-09-11 DIAGNOSIS — Z1231 Encounter for screening mammogram for malignant neoplasm of breast: Secondary | ICD-10-CM

## 2023-10-10 ENCOUNTER — Other Ambulatory Visit: Payer: Self-pay | Admitting: Orthopedic Surgery

## 2023-10-10 DIAGNOSIS — M7121 Synovial cyst of popliteal space [Baker], right knee: Secondary | ICD-10-CM

## 2023-10-10 DIAGNOSIS — M1711 Unilateral primary osteoarthritis, right knee: Secondary | ICD-10-CM

## 2023-10-10 DIAGNOSIS — M25561 Pain in right knee: Secondary | ICD-10-CM

## 2023-10-16 ENCOUNTER — Ambulatory Visit
Admission: RE | Admit: 2023-10-16 | Discharge: 2023-10-16 | Disposition: A | Discharge status: Home / Self Care | Source: Ambulatory Visit | Attending: Orthopedic Surgery | Admitting: Orthopedic Surgery

## 2023-10-16 DIAGNOSIS — M25561 Pain in right knee: Secondary | ICD-10-CM | POA: Diagnosis present

## 2023-10-16 DIAGNOSIS — M1711 Unilateral primary osteoarthritis, right knee: Secondary | ICD-10-CM | POA: Insufficient documentation

## 2023-10-16 DIAGNOSIS — M7121 Synovial cyst of popliteal space [Baker], right knee: Secondary | ICD-10-CM | POA: Insufficient documentation

## 2023-10-25 ENCOUNTER — Ambulatory Visit
Admission: RE | Admit: 2023-10-25 | Discharge: 2023-10-25 | Disposition: A | Discharge status: Home / Self Care | Source: Ambulatory Visit | Attending: Internal Medicine | Admitting: Internal Medicine

## 2023-10-25 DIAGNOSIS — Z1231 Encounter for screening mammogram for malignant neoplasm of breast: Secondary | ICD-10-CM | POA: Insufficient documentation
